# Patient Record
Sex: Female | Born: 1990 | Race: White | Hispanic: No | Marital: Single | Smoking: Former smoker
Health system: Southern US, Community
[De-identification: ages and names within clinical notes are randomized; demographics above are authoritative.]

## PROBLEM LIST (undated history)

## (undated) DIAGNOSIS — J45909 Unspecified asthma, uncomplicated: Secondary | ICD-10-CM

## (undated) HISTORY — DX: Unspecified asthma, uncomplicated: J45.909

---

## 2013-04-18 ENCOUNTER — Ambulatory Visit (INDEPENDENT_AMBULATORY_CARE_PROVIDER_SITE_OTHER): Payer: BC Managed Care – PPO

## 2013-04-18 ENCOUNTER — Telehealth: Payer: Self-pay | Admitting: Nurse Practitioner

## 2013-04-18 ENCOUNTER — Encounter: Payer: Self-pay | Admitting: Family Medicine

## 2013-04-18 ENCOUNTER — Ambulatory Visit (INDEPENDENT_AMBULATORY_CARE_PROVIDER_SITE_OTHER): Payer: BC Managed Care – PPO | Admitting: Family Medicine

## 2013-04-18 VITALS — BP 114/69 | HR 99 | Temp 97.9°F | Ht 64.25 in | Wt 133.8 lb

## 2013-04-18 DIAGNOSIS — R0602 Shortness of breath: Secondary | ICD-10-CM

## 2013-04-18 DIAGNOSIS — R1310 Dysphagia, unspecified: Secondary | ICD-10-CM

## 2013-04-18 LAB — HEPATIC FUNCTION PANEL
Alkaline Phosphatase: 62 U/L (ref 39–117)
Bilirubin, Direct: 0.1 mg/dL (ref 0.0–0.3)
Indirect Bilirubin: 0.4 mg/dL (ref 0.0–0.9)
Total Bilirubin: 0.5 mg/dL (ref 0.3–1.2)
Total Protein: 6.8 g/dL (ref 6.0–8.3)

## 2013-04-18 LAB — POCT CBC
Granulocyte percent: 66.8 %G (ref 37–80)
Lymph, poc: 1.9 (ref 0.6–3.4)
MCV: 90.7 fL (ref 80–97)
POC Granulocyte: 4.4 (ref 2–6.9)
Platelet Count, POC: 179 10*3/uL (ref 142–424)
RBC: 4.2 M/uL (ref 4.04–5.48)

## 2013-04-18 LAB — THYROID PANEL WITH TSH
Free Thyroxine Index: 2.6 (ref 1.0–3.9)
TSH: 1.562 u[IU]/mL (ref 0.350–4.500)

## 2013-04-18 NOTE — Addendum Note (Signed)
Addended by: Bearl Mulberry on: 04/18/2013 05:47 PM   Modules accepted: Orders

## 2013-04-18 NOTE — Patient Instructions (Addendum)
Avoid milk cheese ice cream and dairy products. Avoid caffeine. Remember that [facility] user malleolus had more caffeine cola drinks. In the next few weeks try the more broiled and baked   Foods Take Zantac 150 twice daily over-the-counter before breakfast and supper

## 2013-04-18 NOTE — Telephone Encounter (Signed)
appt given for the am per pt request 
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
## 2013-04-19 ENCOUNTER — Telehealth: Payer: Self-pay | Admitting: Family Medicine

## 2013-04-19 ENCOUNTER — Ambulatory Visit: Payer: Self-pay | Admitting: General Practice

## 2013-04-19 LAB — BASIC METABOLIC PANEL WITH GFR
BUN: 10 mg/dL (ref 6–23)
Chloride: 105 mEq/L (ref 96–112)
GFR, Est Non African American: >89 mL/min
Potassium: 4.2 mEq/L (ref 3.5–5.3)
Sodium: 140 mEq/L (ref 135–145)

## 2013-05-01 ENCOUNTER — Telehealth: Payer: Self-pay | Admitting: Family Medicine

## 2013-05-02 ENCOUNTER — Telehealth: Payer: Self-pay | Admitting: *Deleted

## 2013-05-02 NOTE — Telephone Encounter (Signed)
Pt's mom notified of results. 

## 2013-05-02 NOTE — Telephone Encounter (Signed)
Message copied by Bearl Mulberry on Wed May 02, 2013  6:59 PM ------      Message from: Ernestina Penna      Created: Thu Apr 19, 2013  7:54 AM       Electrolytes and kidney function tests are within normal limits. Blood sugar was 109. She was not fasting, so this is okay.      Thyroid within normal limit ------

## 2013-05-02 NOTE — Telephone Encounter (Signed)
Left message on personal voicemail, per patient's request, that all labs were WNL.

## 2013-05-16 ENCOUNTER — Telehealth: Payer: Self-pay | Admitting: Family Medicine

## 2013-05-16 ENCOUNTER — Ambulatory Visit: Payer: BC Managed Care – PPO | Admitting: Family Medicine

## 2013-05-17 ENCOUNTER — Encounter: Payer: Self-pay | Admitting: Family Medicine

## 2013-05-17 ENCOUNTER — Ambulatory Visit (INDEPENDENT_AMBULATORY_CARE_PROVIDER_SITE_OTHER): Payer: BC Managed Care – PPO | Admitting: Family Medicine

## 2013-05-17 VITALS — BP 114/74 | HR 72 | Temp 97.4°F | Ht 64.25 in | Wt 133.2 lb

## 2013-05-17 DIAGNOSIS — R5381 Other malaise: Secondary | ICD-10-CM

## 2013-05-17 DIAGNOSIS — E559 Vitamin D deficiency, unspecified: Secondary | ICD-10-CM

## 2013-05-17 DIAGNOSIS — R5383 Other fatigue: Secondary | ICD-10-CM

## 2013-05-17 DIAGNOSIS — K5900 Constipation, unspecified: Secondary | ICD-10-CM

## 2013-05-17 DIAGNOSIS — K625 Hemorrhage of anus and rectum: Secondary | ICD-10-CM

## 2013-05-17 DIAGNOSIS — R110 Nausea: Secondary | ICD-10-CM

## 2013-05-17 DIAGNOSIS — R0602 Shortness of breath: Secondary | ICD-10-CM

## 2013-05-17 DIAGNOSIS — R079 Chest pain, unspecified: Secondary | ICD-10-CM

## 2013-05-17 LAB — POCT CBC
HCT, POC: 40.8 % (ref 37.7–47.9)
Lymph, poc: 2.3 (ref 0.6–3.4)
MCH, POC: 30.5 pg (ref 27–31.2)
MCHC: 34 g/dL (ref 31.8–35.4)
MCV: 89.7 fL (ref 80–97)
Platelet Count, POC: 169 10*3/uL (ref 142–424)
RDW, POC: 12.3 %
WBC: 6.3 10*3/uL (ref 4.6–10.2)

## 2013-05-17 LAB — POCT URINE PREGNANCY: Preg Test, Ur: NEGATIVE

## 2013-05-17 NOTE — Progress Notes (Signed)
Subjective:    Patient ID: Selena Trujillo, female    DOB: Jun 10, 1991, 22 y.o.   MRN: [id number]  HPI Please see note from previous office visit. Patient continues to have shortness of breath. She's had increased nausea and decreased appetite for about a week. She also describes fatigue. Her periods are regular although she indicates she possibly could be pregnant. At the last visit on July 2 she had a thyroid CBC and BMP and all these tests were within normal limits. There was a liver function test done also at that time and this was within normal limits. Previous lab reports and x-rays were reviewed with the patient and her mother.   Review of Systems  Constitutional: Positive for appetite change (loss of appetite x 1 week) and fatigue (worsening).  HENT: Negative.   Eyes: Negative.   Respiratory: Positive for shortness of breath (worsening).   Cardiovascular: Positive for chest pain.  Gastrointestinal: Positive for constipation and anal bleeding (bright red x 1 episode last night).  Endocrine: Negative.   Genitourinary: Negative for dysuria and frequency.  Musculoskeletal: Negative.   Skin: Negative.   Allergic/Immunologic: Negative.   Neurological: Positive for light-headedness (with SOB).  Psychiatric/Behavioral: The patient is nervous/anxious (occasional anxiety).        Objective:   Physical Exam  Constitutional: She is oriented to person, place, and time. She appears well-developed and well-nourished. No distress.  HENT:  Head: Normocephalic and atraumatic.  Right Ear: External ear normal.  Left Ear: External ear normal.  Nose: Nose normal.  Mouth/Throat: Oropharynx is clear and moist. No oropharyngeal exudate.  Eyes: Right eye exhibits no discharge. Left eye exhibits no discharge. No scleral icterus.  Neck: Normal range of motion. Neck supple. No thyromegaly present.  Cardiovascular: Normal rate, regular rhythm, normal heart sounds and intact distal pulses.  Exam reveals no  gallop and no friction rub.   No murmur heard. At 84 per minute  Pulmonary/Chest: Effort normal and breath sounds normal. No respiratory distress. She has no wheezes. She has no rales.  Abdominal: Soft. Bowel sounds are normal. She exhibits no distension and no mass. There is tenderness. There is no rebound and no guarding.  Slight left upper quadrant tenderness Rectal exam revealed no masses and no significant tenderness. A Hemoccult card that was done from the rectal exam was negative for blood.  Genitourinary: Guaiac negative stool.  Musculoskeletal: Normal range of motion. She exhibits no edema and no tenderness.  Lymphadenopathy:    She has no cervical adenopathy.  Neurological: She is alert and oriented to person, place, and time. She has normal reflexes.  Skin: Skin is warm and dry. No rash noted. She is not diaphoretic. No erythema. No pallor.  Psychiatric: She has a normal mood and affect. Her behavior is normal. Judgment and thought content normal.   EKG: Within normal limit      Assessment & Plan:  1. Fatigue - POCT urine pregnancy - POCT CBC - hCG, quantitative, pregnancy - Vitamin D 25 hydroxy  2. Nausea alone - POCT urine pregnancy - hCG, quantitative, pregnancy  3. Chest pain - EKG 12-Lead  4. Shortness of breath  5. Vitamin D deficiency, history of  6. Rectal bleeding  7. Constipation  Patient Instructions  Continue to drink plenty of fluids and avoid caffeine. Eat more fresh vegetables and fruit Once we get lab work back we will consider trying an antidepressant if serum pregnancy test is negative   May want to try some Zantac 75  one twice daily before breakfast and supper  Nyra Capes MD

## 2013-05-17 NOTE — Patient Instructions (Addendum)
Continue to drink plenty of fluids and avoid caffeine. Eat more fresh vegetables and fruit Once we get lab work back we will consider trying an antidepressant if serum pregnancy test is negative

## 2013-05-22 NOTE — Progress Notes (Signed)
Pt aware of lab results. Vit D called to [facility]. Pt aware to recheck bloodwork in 3 mos. Declines Lexapro at this time. Pt stated that bloodwork was drawn on Thursday and her arm bruised on Sunday. Offered her an appointment with dr. [place] Charon today, she said she would discuss with her mom, but will put ice on it and let us know how it is.

## 2013-11-08 ENCOUNTER — Other Ambulatory Visit: Payer: Self-pay | Admitting: Family Medicine

## 2013-11-12 NOTE — Telephone Encounter (Signed)
Do you want her to continue with rx vitamin D? Last visit 04-2013. Please advise

## 2013-11-12 NOTE — Telephone Encounter (Signed)
Please continue with this but patient needs to get a repeat vitamin D level

## 2013-11-13 NOTE — Telephone Encounter (Signed)
Pt.notified

## 2015-01-10 IMAGING — CR DG CHEST 2V
2 series · 2 of 2 positions shown · non-contrast
Comparison: None

CLINICAL DATA: Shortness of breath

CHEST - 2 VIEW

[view not recorded (1 of 2)]
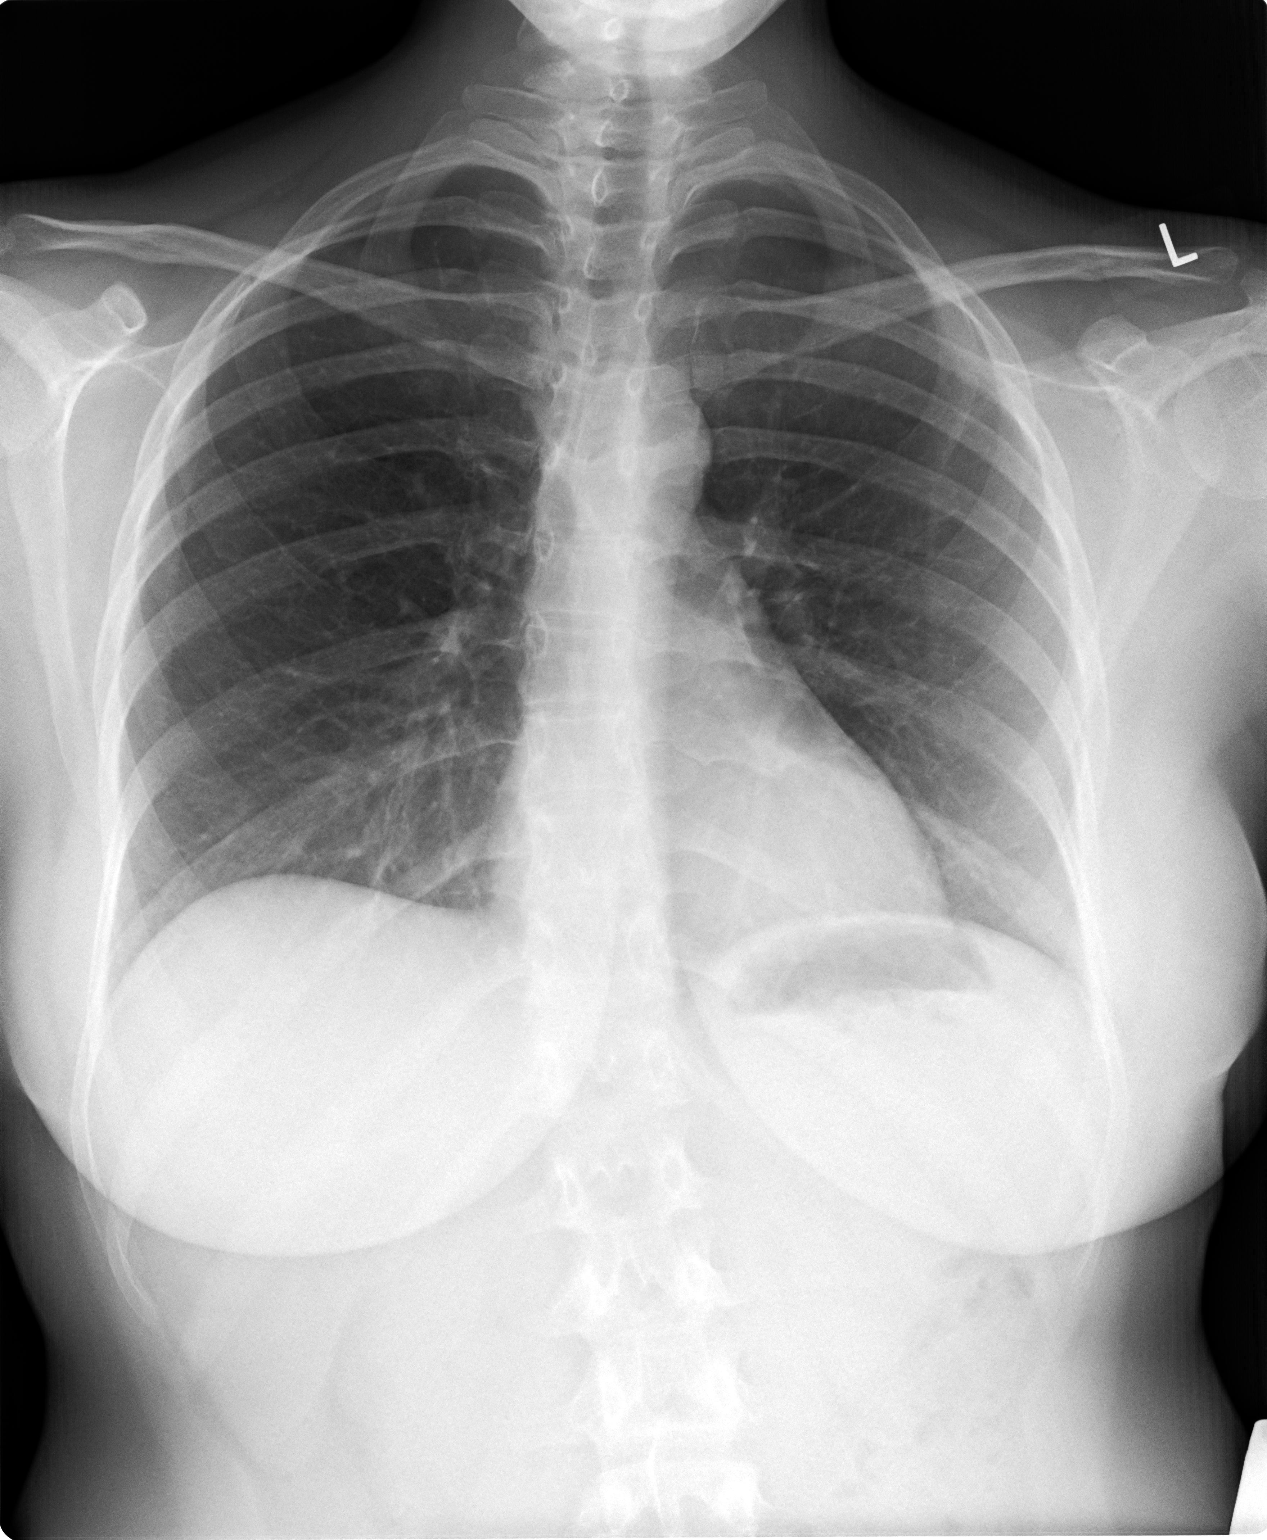

[view not recorded (2 of 2)]
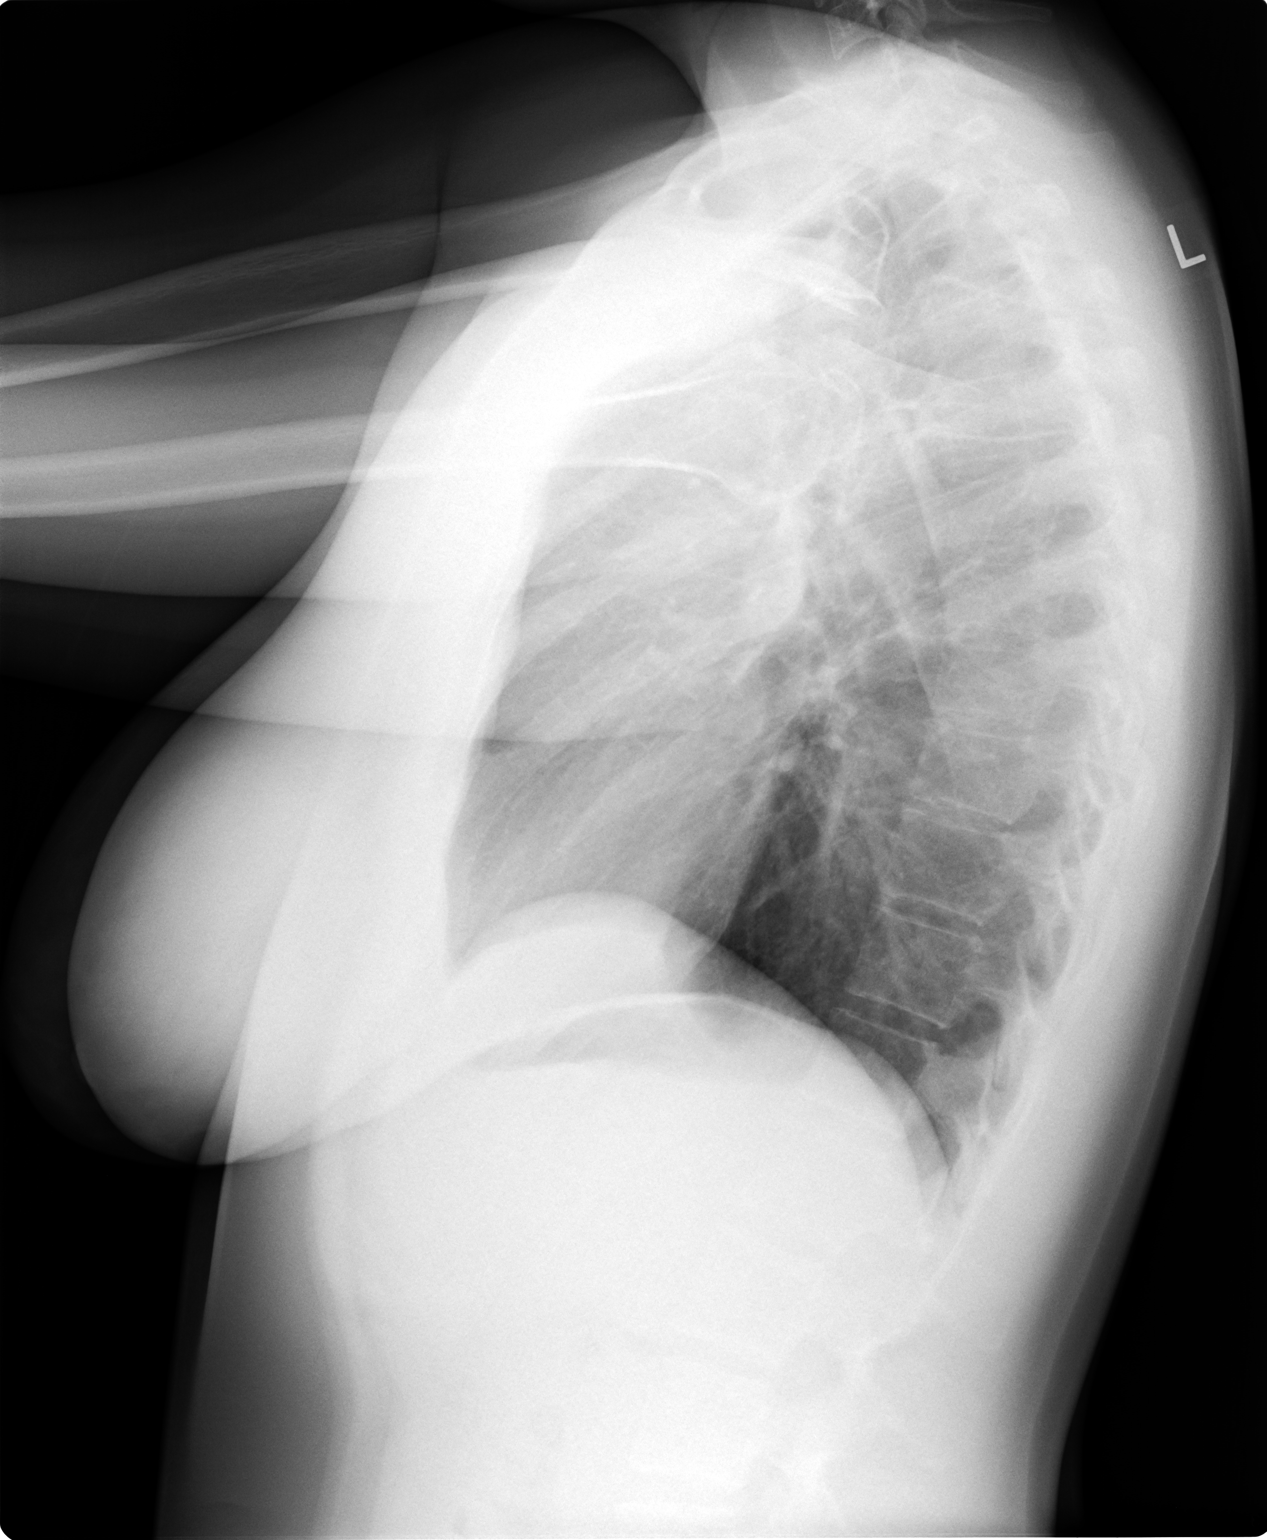

[2 of 2 positions shown; findings below may reference images not displayed]

FINDINGS: Normal heart size, mediastinal contours, and pulmonary vascularity.
Lungs clear.
No pleural effusion or pneumothorax.
Broad-based dextroconvex thoracic scoliosis.
IMPRESSION: No acute abnormalities.
